# Patient Record
Sex: Female | Born: 1935 | Race: Black or African American | Hispanic: No | Marital: Married | Smoking: Former smoker
Health system: Southern US, Community
[De-identification: ages and names within clinical notes are randomized; demographics above are authoritative.]

## PROBLEM LIST (undated history)

## (undated) DIAGNOSIS — F0390 Unspecified dementia without behavioral disturbance: Secondary | ICD-10-CM

## (undated) DIAGNOSIS — F329 Major depressive disorder, single episode, unspecified: Secondary | ICD-10-CM

## (undated) DIAGNOSIS — F32A Depression, unspecified: Secondary | ICD-10-CM

## (undated) DIAGNOSIS — E039 Hypothyroidism, unspecified: Secondary | ICD-10-CM

## (undated) DIAGNOSIS — R569 Unspecified convulsions: Secondary | ICD-10-CM

## (undated) HISTORY — PX: NO PAST SURGERIES: SHX2092

---

## 2005-07-01 ENCOUNTER — Ambulatory Visit: Payer: Self-pay | Admitting: Vascular Surgery

## 2005-07-01 ENCOUNTER — Other Ambulatory Visit: Payer: Self-pay

## 2005-07-06 ENCOUNTER — Ambulatory Visit: Payer: Self-pay | Admitting: Vascular Surgery

## 2007-08-06 ENCOUNTER — Other Ambulatory Visit: Payer: Self-pay

## 2007-08-06 ENCOUNTER — Emergency Department: Payer: Self-pay | Admitting: Emergency Medicine

## 2007-12-24 ENCOUNTER — Ambulatory Visit: Payer: Self-pay | Admitting: Family Medicine

## 2012-01-28 ENCOUNTER — Emergency Department: Payer: Self-pay | Admitting: Unknown Physician Specialty

## 2012-01-28 LAB — CBC
HCT: 41.2 % (ref 35.0–47.0)
HGB: 13.5 g/dL (ref 12.0–16.0)
MCV: 90 fL (ref 80–100)
Platelet: 171 10*3/uL (ref 150–440)
RBC: 4.59 10*6/uL (ref 3.80–5.20)
WBC: 4.6 10*3/uL (ref 3.6–11.0)

## 2012-01-29 LAB — COMPREHENSIVE METABOLIC PANEL
Albumin: 4.4 g/dL (ref 3.4–5.0)
Alkaline Phosphatase: 143 U/L — ABNORMAL HIGH (ref 50–136)
BUN: 10 mg/dL (ref 7–18)
Bilirubin,Total: 0.2 mg/dL (ref 0.2–1.0)
Chloride: 104 mmol/L (ref 98–107)
Creatinine: 0.73 mg/dL (ref 0.60–1.30)
EGFR (African American): >60
Glucose: 93 mg/dL (ref 65–99)
Osmolality: 284 (ref 275–301)
SGOT(AST): 19 U/L (ref 15–37)
Sodium: 143 mmol/L (ref 136–145)
Total Protein: 8.7 g/dL — ABNORMAL HIGH (ref 6.4–8.2)

## 2012-01-29 LAB — URINALYSIS, COMPLETE
Ketone: NEGATIVE
Nitrite: NEGATIVE
Protein: NEGATIVE
RBC,UR: 1 /HPF (ref 0–5)
Specific Gravity: 1.003 (ref 1.003–1.030)
Squamous Epithelial: 1
WBC UR: 2 /HPF (ref 0–5)

## 2012-01-29 LAB — PHENYTOIN LEVEL, TOTAL: Dilantin: 14.2 ug/mL (ref 10.0–20.0)

## 2012-01-29 LAB — TROPONIN I: Troponin-I: <0.02 ng/mL

## 2013-11-11 ENCOUNTER — Emergency Department: Payer: Self-pay | Admitting: Emergency Medicine

## 2013-11-11 LAB — COMPREHENSIVE METABOLIC PANEL
ALK PHOS: 138 U/L — AB
ALT: 26 U/L (ref 12–78)
AST: 24 U/L (ref 15–37)
Albumin: 3.2 g/dL — ABNORMAL LOW (ref 3.4–5.0)
Anion Gap: 6 — ABNORMAL LOW (ref 7–16)
BILIRUBIN TOTAL: 0.1 mg/dL — AB (ref 0.2–1.0)
BUN: 17 mg/dL (ref 7–18)
CREATININE: 0.62 mg/dL (ref 0.60–1.30)
Calcium, Total: 8.8 mg/dL (ref 8.5–10.1)
Chloride: 104 mmol/L (ref 98–107)
Co2: 33 mmol/L — ABNORMAL HIGH (ref 21–32)
GLUCOSE: 81 mg/dL (ref 65–99)
Osmolality: 286 (ref 275–301)
Potassium: 3.5 mmol/L (ref 3.5–5.1)
Sodium: 143 mmol/L (ref 136–145)
Total Protein: 7 g/dL (ref 6.4–8.2)

## 2013-11-11 LAB — URINALYSIS, COMPLETE
Bacteria: NONE SEEN
Bilirubin,UR: NEGATIVE
Blood: NEGATIVE
Glucose,UR: NEGATIVE mg/dL (ref 0–75)
KETONE: NEGATIVE
Leukocyte Esterase: NEGATIVE
Nitrite: NEGATIVE
PROTEIN: NEGATIVE
Ph: 7 (ref 4.5–8.0)
RBC,UR: NONE SEEN /HPF (ref 0–5)
Specific Gravity: 1.008 (ref 1.003–1.030)
Squamous Epithelial: <1

## 2013-11-11 LAB — CBC
HCT: 37.8 % (ref 35.0–47.0)
HGB: 12.3 g/dL (ref 12.0–16.0)
MCH: 31.4 pg (ref 26.0–34.0)
MCHC: 32.5 g/dL (ref 32.0–36.0)
MCV: 97 fL (ref 80–100)
Platelet: 152 10*3/uL (ref 150–440)
RBC: 3.91 10*6/uL (ref 3.80–5.20)
RDW: 13.6 % (ref 11.5–14.5)
WBC: 4.3 10*3/uL (ref 3.6–11.0)

## 2013-11-11 LAB — PHENOBARBITAL LEVEL: Phenobarbital: 39.8 ug/mL (ref 15.0–40.0)

## 2013-11-11 LAB — TROPONIN I: Troponin-I: <0.02 ng/mL

## 2013-11-11 LAB — PHENYTOIN LEVEL, TOTAL: Dilantin: 19 ug/mL (ref 10.0–20.0)

## 2014-05-08 ENCOUNTER — Emergency Department: Payer: Self-pay | Admitting: Emergency Medicine

## 2015-01-16 ENCOUNTER — Encounter: Payer: Self-pay | Admitting: Emergency Medicine

## 2015-01-16 ENCOUNTER — Observation Stay
Admission: EM | Admit: 2015-01-16 | Discharge: 2015-01-17 | Disposition: A | Discharge status: Skilled Nursing Facility | Payer: Medicare Other | Attending: Internal Medicine | Admitting: Internal Medicine

## 2015-01-16 ENCOUNTER — Emergency Department: Payer: Medicare Other

## 2015-01-16 ENCOUNTER — Observation Stay: Payer: Medicare Other

## 2015-01-16 DIAGNOSIS — Z79899 Other long term (current) drug therapy: Secondary | ICD-10-CM | POA: Diagnosis not present

## 2015-01-16 DIAGNOSIS — N390 Urinary tract infection, site not specified: Principal | ICD-10-CM | POA: Insufficient documentation

## 2015-01-16 DIAGNOSIS — R4182 Altered mental status, unspecified: Secondary | ICD-10-CM | POA: Insufficient documentation

## 2015-01-16 DIAGNOSIS — F0391 Unspecified dementia with behavioral disturbance: Secondary | ICD-10-CM | POA: Insufficient documentation

## 2015-01-16 DIAGNOSIS — F329 Major depressive disorder, single episode, unspecified: Secondary | ICD-10-CM | POA: Insufficient documentation

## 2015-01-16 DIAGNOSIS — G40909 Epilepsy, unspecified, not intractable, without status epilepticus: Secondary | ICD-10-CM | POA: Insufficient documentation

## 2015-01-16 DIAGNOSIS — F0390 Unspecified dementia without behavioral disturbance: Secondary | ICD-10-CM | POA: Diagnosis present

## 2015-01-16 DIAGNOSIS — E039 Hypothyroidism, unspecified: Secondary | ICD-10-CM | POA: Insufficient documentation

## 2015-01-16 DIAGNOSIS — R140 Abdominal distension (gaseous): Secondary | ICD-10-CM

## 2015-01-16 DIAGNOSIS — R569 Unspecified convulsions: Secondary | ICD-10-CM

## 2015-01-16 HISTORY — DX: Depression, unspecified: F32.A

## 2015-01-16 HISTORY — DX: Unspecified convulsions: R56.9

## 2015-01-16 HISTORY — DX: Unspecified dementia, unspecified severity, without behavioral disturbance, psychotic disturbance, mood disturbance, and anxiety: F03.90

## 2015-01-16 HISTORY — DX: Major depressive disorder, single episode, unspecified: F32.9

## 2015-01-16 HISTORY — DX: Hypothyroidism, unspecified: E03.9

## 2015-01-16 LAB — URINALYSIS COMPLETE WITH MICROSCOPIC (ARMC ONLY)
BILIRUBIN URINE: NEGATIVE
GLUCOSE, UA: NEGATIVE mg/dL
HGB URINE DIPSTICK: NEGATIVE
KETONES UR: NEGATIVE mg/dL
Nitrite: NEGATIVE
PROTEIN: NEGATIVE mg/dL
RBC / HPF: NONE SEEN RBC/hpf (ref 0–5)
SQUAMOUS EPITHELIAL / LPF: NONE SEEN
Specific Gravity, Urine: 1.01 (ref 1.005–1.030)
pH: 7 (ref 5.0–8.0)

## 2015-01-16 LAB — CBC WITH DIFFERENTIAL/PLATELET
Basophils Absolute: 0 10*3/uL (ref 0–0.1)
Basophils Relative: 0 %
Eosinophils Absolute: 0 10*3/uL (ref 0–0.7)
Eosinophils Relative: 1 %
HEMATOCRIT: 38.3 % (ref 35.0–47.0)
Hemoglobin: 12.7 g/dL (ref 12.0–16.0)
Lymphocytes Relative: 33 %
Lymphs Abs: 1.1 10*3/uL (ref 1.0–3.6)
MCH: 30.9 pg (ref 26.0–34.0)
MCHC: 33.1 g/dL (ref 32.0–36.0)
MCV: 93.6 fL (ref 80.0–100.0)
MONO ABS: 0.4 10*3/uL (ref 0.2–0.9)
Monocytes Relative: 11 %
NEUTROS ABS: 1.8 10*3/uL (ref 1.4–6.5)
Neutrophils Relative %: 55 %
Platelets: 203 10*3/uL (ref 150–440)
RBC: 4.09 MIL/uL (ref 3.80–5.20)
RDW: 14.1 % (ref 11.5–14.5)
WBC: 3.3 10*3/uL — ABNORMAL LOW (ref 3.6–11.0)

## 2015-01-16 LAB — COMPREHENSIVE METABOLIC PANEL
ALBUMIN: 3.7 g/dL (ref 3.5–5.0)
ALK PHOS: 133 U/L — AB (ref 38–126)
ALT: 18 U/L (ref 14–54)
AST: 28 U/L (ref 15–41)
Anion gap: 9 (ref 5–15)
BUN: 19 mg/dL (ref 6–20)
CALCIUM: 9 mg/dL (ref 8.9–10.3)
CHLORIDE: 100 mmol/L — AB (ref 101–111)
CO2: 30 mmol/L (ref 22–32)
Creatinine, Ser: 0.73 mg/dL (ref 0.44–1.00)
GLUCOSE: 116 mg/dL — AB (ref 65–99)
POTASSIUM: 4.1 mmol/L (ref 3.5–5.1)
Sodium: 139 mmol/L (ref 135–145)
Total Bilirubin: 0.3 mg/dL (ref 0.3–1.2)
Total Protein: 7.4 g/dL (ref 6.5–8.1)

## 2015-01-16 LAB — TROPONIN I

## 2015-01-16 LAB — ETHANOL

## 2015-01-16 LAB — SALICYLATE LEVEL

## 2015-01-16 LAB — ACETAMINOPHEN LEVEL

## 2015-01-16 MED ORDER — HALOPERIDOL 1 MG PO TABS
0.5000 mg | ORAL_TABLET | Freq: Three times a day (TID) | ORAL | Status: DC | PRN
  Administered 2015-01-16: 0.5 mg via ORAL
  Filled 2015-01-16: qty 1

## 2015-01-16 MED ORDER — OLANZAPINE 5 MG PO TABS
5.0000 mg | ORAL_TABLET | Freq: Every day | ORAL | Status: DC
  Filled 2015-01-16: qty 1

## 2015-01-16 MED ORDER — ONDANSETRON HCL 4 MG PO TABS: 4.0000 mg | ORAL_TABLET | Freq: Four times a day (QID) | ORAL | Status: DC | PRN

## 2015-01-16 MED ORDER — LORAZEPAM 2 MG/ML IJ SOLN
0.5000 mg | Freq: Once | INTRAMUSCULAR | Status: AC
  Administered 2015-01-16: 0.5 mg via INTRAVENOUS
  Filled 2015-01-16: qty 1

## 2015-01-16 MED ORDER — ACETAMINOPHEN 650 MG RE SUPP: 650.0000 mg | Freq: Four times a day (QID) | RECTAL | Status: DC | PRN

## 2015-01-16 MED ORDER — LEVOTHYROXINE SODIUM 25 MCG PO TABS
25.0000 ug | ORAL_TABLET | Freq: Every day | ORAL | Status: DC
  Administered 2015-01-17: 25 ug via ORAL
  Filled 2015-01-16: qty 1

## 2015-01-16 MED ORDER — PHENOBARBITAL 32.4 MG PO TABS
64.8000 mg | ORAL_TABLET | Freq: Every day | ORAL | Status: DC
  Administered 2015-01-16: 64.8 mg via ORAL
  Filled 2015-01-16: qty 2

## 2015-01-16 MED ORDER — QUETIAPINE FUMARATE 25 MG PO TABS
50.0000 mg | ORAL_TABLET | Freq: Three times a day (TID) | ORAL | Status: DC
  Administered 2015-01-17: 50 mg via ORAL
  Filled 2015-01-16: qty 2

## 2015-01-16 MED ORDER — DONEPEZIL HCL 5 MG PO TABS
10.0000 mg | ORAL_TABLET | Freq: Every day | ORAL | Status: DC
  Administered 2015-01-17: 10 mg via ORAL
  Filled 2015-01-16: qty 2

## 2015-01-16 MED ORDER — DIPHENHYDRAMINE HCL 50 MG/ML IJ SOLN
INTRAMUSCULAR | Status: AC
  Administered 2015-01-16: 12.5 mg via INTRAVENOUS
  Filled 2015-01-16: qty 1

## 2015-01-16 MED ORDER — HALOPERIDOL LACTATE 5 MG/ML IJ SOLN
INTRAMUSCULAR | Status: AC
  Administered 2015-01-16: 5 mg via INTRAMUSCULAR
  Filled 2015-01-16: qty 1

## 2015-01-16 MED ORDER — LEVOFLOXACIN 750 MG PO TABS
750.0000 mg | ORAL_TABLET | Freq: Once | ORAL | Status: AC
  Administered 2015-01-16: 750 mg via ORAL
  Filled 2015-01-16: qty 1

## 2015-01-16 MED ORDER — DIPHENHYDRAMINE HCL 50 MG/ML IJ SOLN
12.5000 mg | Freq: Once | INTRAMUSCULAR | Status: AC
  Administered 2015-01-16: 12.5 mg via INTRAVENOUS

## 2015-01-16 MED ORDER — LORAZEPAM 2 MG/ML IJ SOLN
0.5000 mg | Freq: Once | INTRAMUSCULAR | Status: AC
  Administered 2015-01-16: 0.5 mg via INTRAVENOUS

## 2015-01-16 MED ORDER — ONDANSETRON HCL 4 MG/2ML IJ SOLN: 4.0000 mg | Freq: Four times a day (QID) | INTRAMUSCULAR | Status: DC | PRN

## 2015-01-16 MED ORDER — LEVOFLOXACIN 750 MG PO TABS
750.0000 mg | ORAL_TABLET | Freq: Every day | ORAL | Status: DC
  Administered 2015-01-17: 10:00:00 750 mg via ORAL
  Filled 2015-01-16: qty 1

## 2015-01-16 MED ORDER — DIVALPROEX SODIUM 125 MG PO CSDR
125.0000 mg | DELAYED_RELEASE_CAPSULE | Freq: Two times a day (BID) | ORAL | Status: DC
  Administered 2015-01-16 – 2015-01-17 (×2): 125 mg via ORAL
  Filled 2015-01-16 (×3): qty 1

## 2015-01-16 MED ORDER — ENOXAPARIN SODIUM 40 MG/0.4ML ~~LOC~~ SOLN
40.0000 mg | SUBCUTANEOUS | Status: DC
Start: 2015-01-16 — End: 2015-01-17
  Administered 2015-01-17: 10:00:00 40 mg via SUBCUTANEOUS
  Filled 2015-01-16: qty 0.4

## 2015-01-16 MED ORDER — PHENYTOIN SODIUM EXTENDED 100 MG PO CAPS
100.0000 mg | ORAL_CAPSULE | Freq: Every day | ORAL | Status: DC
  Administered 2015-01-17: 100 mg via ORAL
  Filled 2015-01-16: qty 1

## 2015-01-16 MED ORDER — MIRTAZAPINE 15 MG PO TABS
15.0000 mg | ORAL_TABLET | Freq: Every day | ORAL | Status: DC
  Administered 2015-01-16: 15 mg via ORAL
  Filled 2015-01-16: qty 1

## 2015-01-16 MED ORDER — HALOPERIDOL LACTATE 5 MG/ML IJ SOLN
5.0000 mg | Freq: Once | INTRAMUSCULAR | Status: AC
  Administered 2015-01-16: 5 mg via INTRAMUSCULAR

## 2015-01-16 MED ORDER — OLANZAPINE 5 MG PO TABS
5.0000 mg | ORAL_TABLET | Freq: Every day | ORAL | Status: DC
  Administered 2015-01-16: 5 mg via ORAL
  Filled 2015-01-16 (×2): qty 1

## 2015-01-16 MED ORDER — ACETAMINOPHEN 325 MG PO TABS: 650.0000 mg | ORAL_TABLET | Freq: Four times a day (QID) | ORAL | Status: DC | PRN

## 2015-01-16 MED ORDER — SODIUM CHLORIDE 0.9 % IV SOLN
INTRAVENOUS | Status: DC
  Administered 2015-01-16: 23:00:00 via INTRAVENOUS

## 2015-01-16 NOTE — ED Notes (Signed)
Patient changed.  Skin care given

## 2015-01-16 NOTE — ED Notes (Signed)
Patient changed.  Skin care given 

## 2015-01-16 NOTE — ED Provider Notes (Signed)
[facility] Emergency Department Provider Note  ____________________________________________  Time seen: Approximately 4:09 PM  I have reviewed the triage vital signs and the nursing notes.   HISTORY  Chief Complaint Altered Mental Status  Caveat-history of present illness and review of systems limited secondary to the patient's altered mental status/poor cooperation, dementia. All history of present illness review of systems obtained from staff at [facility] health care.  HPI Geneal Huebert is a 79 y.o. female with history of Alzheimer's dementia, hypothyroidism, conversion disorder with seizures or convulsions, depression, restlessness and agitation presents from [place] [facility] for worsening agitation since yesterday. Staff at the facility report that the patient has just been screaming and crying since yesterday. She has not had any recent illness including no coughing, vomiting, diarrhea, fevers or falls.  Staff reports that they don't know her well however, she has only been at the facility for one week. Family reported to staff at [facility] that sometimes she screams and cries when she is hungry, staff has been trying to feed the patient however this only helps her intermittently.No other history is obtained.   Past Medical History  Diagnosis Date  . Dementia   . Hypothyroidism   . Seizures   . Depression     Patient Active Problem List   Diagnosis Date Noted  . UTI (lower urinary tract infection) 01/16/2015  . Dementia 01/16/2015  . Hypothyroidism 01/16/2015  . Seizures 01/16/2015    Past Surgical History  Procedure Laterality Date  . No past surgeries      Current Outpatient Rx  Name  Route  Sig  Dispense  Refill  . acetaminophen (TYLENOL) 650 MG CR tablet   Oral   Take 650 mg by mouth every 12 (twelve) hours.         . calcium-vitamin D (OSCAL WITH D) 500-200 MG-UNIT per tablet   Oral   Take 1 tablet by mouth every 12  (twelve) hours.         . cholecalciferol (VITAMIN D) 1000 UNITS tablet   Oral   Take 1,000 Units by mouth daily.         . divalproex (DEPAKOTE SPRINKLES) 125 MG capsule   Oral   Take 125 mg by mouth every 12 (twelve) hours.         Marland Kitchen donepezil (ARICEPT) 10 MG tablet   Oral   Take 10 mg by mouth daily.         . furosemide (LASIX) 20 MG tablet   Oral   Take 20 mg by mouth daily as needed for edema.         . haloperidol (HALDOL) 0.5 MG tablet   Oral   Take 0.5 mg by mouth every 8 (eight) hours as needed for agitation.         Marland Kitchen levothyroxine (SYNTHROID, LEVOTHROID) 25 MCG tablet   Oral   Take 25 mcg by mouth daily before breakfast.         . loratadine (CLARITIN) 10 MG tablet   Oral   Take 10 mg by mouth daily.         . mirtazapine (REMERON) 15 MG tablet   Oral   Take 15 mg by mouth at bedtime.         Marland Kitchen OLANZapine (ZYPREXA) 5 MG tablet   Oral   Take 5 mg by mouth at bedtime.         Marland Kitchen PHENobarbital (LUMINAL) 64.8 MG tablet   Oral  Take 64.8 mg by mouth at bedtime.         . phenytoin (DILANTIN) 100 MG ER capsule   Oral   Take 100 mg by mouth daily.         . QUEtiapine (SEROQUEL) 50 MG tablet   Oral   Take 50 mg by mouth 3 (three) times daily.           Allergies Review of patient's allergies indicates no known allergies.  Family History  Problem Relation Age of Onset  . Family history unknown: Yes    Social History History  Substance Use Topics  . Smoking status: Not on file  . Smokeless tobacco: Not on file  . Alcohol Use: No    Review of Systems   Caveat-history of present illness and review of systems limited secondary to the patient's altered mental status/poor cooperation, dementia. All history of present illness review of systems obtained from staff at [facility] health care. ____________________________________________   PHYSICAL EXAM:  Filed Vitals:   01/16/15 1624 01/16/15 2054  BP: 150/81 140/76   Pulse: 84 82  Temp: 97.8 F (36.6 C)   TempSrc: Oral   Resp: 16 15  Height:  (1.651 m)   Weight: 150 lb (68.04 kg)   SpO2: 94% 94%    Constitutional: Sitting up in bed with eyes closed, crying, screaming, does not follow commands, bent forward slightly at the hips. She is thin/achectic-appearing. Eyes: Conjunctivae are normal. PERRL. EOMI. Head: Atraumatic. Nose: No congestion/rhinnorhea. Mouth/Throat: Mucous membranes are moist.  Oropharynx non-erythematous. Neck: No stridor.   Cardiovascular: Normal rate, regular rhythm. Grossly normal heart sounds.  Good peripheral circulation. Respiratory: Normal respiratory effort.  No retractions. Lungs CTAB. Gastrointestinal: Soft and nontender. No distention.  No CVA tenderness. Genitourinary: deferred Musculoskeletal: No lower extremity tenderness nor edema.  No joint effusions. Neurologic:  She screams and cries intermittently, then becomes tired and will stop screaming and crying, mumbles that she does not want to be touched, then begin screaming and crying again. She appears to move the extremities equally however does not follow commands or cooperate with formal neurological testing.  Skin:  Skin is warm, dry and intact. No rash noted. Psychiatric: unable to assess  ____________________________________________   LABS (all labs ordered are listed, but only abnormal results are displayed)  Labs Reviewed  CBC WITH DIFFERENTIAL/PLATELET - Abnormal; Notable for the following:    WBC 3.3 (*)    All other components within normal limits  COMPREHENSIVE METABOLIC PANEL - Abnormal; Notable for the following:    Chloride 100 (*)    Glucose, Bld 116 (*)    Alkaline Phosphatase 133 (*)    All other components within normal limits  ACETAMINOPHEN LEVEL - Abnormal; Notable for the following:    Acetaminophen (Tylenol), Serum <10 (*)    All other components within normal limits  URINALYSIS COMPLETEWITH MICROSCOPIC ([facility] ONLY) - Abnormal;  Notable for the following:    Color, Urine YELLOW (*)    APPearance HAZY (*)    Leukocytes, UA 3+ (*)    Bacteria, UA RARE (*)    All other components within normal limits  TROPONIN I  ETHANOL  SALICYLATE LEVEL   ____________________________________________  EKG  ED ECG REPORT I, Gayla Doss, the attending physician, personally viewed and interpreted this ECG.   Date: 01/16/2015  EKG Time: 16:37  Rate: 83  Rhythm: normal sinus rhythm  Axis: left  Intervals:none  ST&T Change: T wave inversions in V2/nonspecific T-wave abnormalities.  No acute ST segment elevation.  ____________________________________________  RADIOLOGY  CXR  IMPRESSION: Left diaphragm elevated by underlying distended bowel. No acute cardiopulmonary findings.  ____________________________________________   PROCEDURES  Procedure(s) performed: None  Critical Care performed: No  ____________________________________________   INITIAL IMPRESSION / ASSESSMENT AND PLAN / ED COURSE  Pertinent labs & imaging results that were available during my care of the patient were reviewed by me and considered in my medical decision making (see chart for details).  Myesha Stillion is a 79 y.o. female with history of Alzheimer's dementia, hypothyroidism, conversion disorder with seizures or convulsions, depression, restlessness and agitation presents from [place] [facility] for worsening agitation since yesterday. On arrival to the emergency Department, vital signs stable, she is afebrile however she is sitting up in bed, screaming, crying, does not follow commands, is not able to verbalize anything other than the fact that she does not want to be touched.  ----------------------------------------- 8:13 PM on 01/16/2015 ----------------------------------------- Labs notable for mild leukopenia, mild nonspecific alkaline phosphatase elevation. Urinalysis consistent with urinary tract infection, 3+ leuk esterase,  too numerous to count white blood cells from a catheterized specimen. She rests intermittently and then awakens and begins screaming again, this seems to improve when she is fed and we did manage to give her 750 mg Levaquin dissolved in applesauce. I discussed the case with [place] [facility] and given that she continues to cry out intermittently, they are uncomfortable taking her back tonight. I discussed admission with the hospitalist given that she has change in mental status and documented urinary tract infection. Dr. Anne Hahn will evaluate the patient. Chest x-ray shows elevation of the left diaphragm caused by underlying distended bowel or stomach. We attempted to obtain a CT scan of the abdomen/pelvis to further evaluate for any acute intra-abdominal process however the patient is curled into a ball, will not stretch her legs out. She received Ativan, Haldol, Benadryl and is still unable to cooperate. She has no abdominal tenderness. The remainder of her liver function tests are within limits and given benign abdominal exam, will not pursue this study at this time. Additionally, I had ordered a CT scan of her head for the sake of completing her workup but because she cannot be positioned appropriately, will not obtain that at this time. According to staff at [facility] health care, she has had no falls, no head trauma recently. She does continue to move all extremities equally making acute CVA unlikely.  ____________________________________________   FINAL CLINICAL IMPRESSION(S) / ED DIAGNOSES  Final diagnoses:  Altered mental status, unspecified altered mental status type  UTI (lower urinary tract infection)      Gayla Doss, MD 01/16/15 2118

## 2015-01-16 NOTE — H&P (Signed)
[facility] - [place] at [facility]   PATIENT NAME: Julia Coffey    MR#:  [id number]  DATE OF BIRTH:  02-24-1936  DATE OF ADMISSION:  01/16/2015  PRIMARY CARE PHYSICIAN: No primary care provider on file.      REQUESTING/REFERRING PHYSICIAN: Inocencio Homes, MD  CHIEF COMPLAINT:   Chief Complaint  Patient presents with  . Altered Mental Status    HISTORY OF PRESENT ILLNESS:  Julia Coffey  is a 79 y.o. female who presents from nursing facility where she has just recently been admitted for dementia. She was sent to the ED due to significant moaning, seemingly in pain. Patient is severely demented, nonverbal and is unable to give any history. She also does not follow commands. Nursing facility states that she has had increasing moaning and crying out for an unknown cause for the past couple of days she is sent to the ED for evaluation. In the ED she was found only to have a UTI based on UA suspicious for the same. Upon contacting the nursing facility, the patient transferred back, they stated that they would be unable to take her back tonight due to an inability to provide medication to keep her calm, even though she was able to take by mouth medications and showed some improvement here in the ED.  PAST MEDICAL HISTORY:   Past Medical History  Diagnosis Date  . Dementia   . Hypothyroidism   . Seizures   . Depression     PAST SURGICAL HISTORY:   Past Surgical History  Procedure Laterality Date  . No past surgeries     **Past surgical history unknown and unable to obtain this information from patient due to dementia. SOCIAL HISTORY:   History  Substance Use Topics  . Smoking status: Not on file  . Smokeless tobacco: Not on file  . Alcohol Use: No    FAMILY HISTORY:   Family History  Problem Relation Age of Onset  . Family history unknown: Yes   **Patient unable to give family history due to dementia DRUG ALLERGIES:  No Known Allergies  MEDICATIONS  AT HOME:   Prior to Admission medications   Medication Sig Start Date End Date Taking? Authorizing Provider  acetaminophen (TYLENOL) 650 MG CR tablet Take 650 mg by mouth every 12 (twelve) hours.   Yes Historical Provider, MD  calcium-vitamin D (OSCAL WITH D) 500-200 MG-UNIT per tablet Take 1 tablet by mouth every 12 (twelve) hours.   Yes Historical Provider, MD  cholecalciferol (VITAMIN D) 1000 UNITS tablet Take 1,000 Units by mouth daily.   Yes Historical Provider, MD  divalproex (DEPAKOTE SPRINKLES) 125 MG capsule Take 125 mg by mouth every 12 (twelve) hours.   Yes Historical Provider, MD  donepezil (ARICEPT) 10 MG tablet Take 10 mg by mouth daily.   Yes Historical Provider, MD  furosemide (LASIX) 20 MG tablet Take 20 mg by mouth daily as needed for edema.   Yes Historical Provider, MD  haloperidol (HALDOL) 0.5 MG tablet Take 0.5 mg by mouth every 8 (eight) hours as needed for agitation.   Yes Historical Provider, MD  levothyroxine (SYNTHROID, LEVOTHROID) 25 MCG tablet Take 25 mcg by mouth daily before breakfast.   Yes Historical Provider, MD  loratadine (CLARITIN) 10 MG tablet Take 10 mg by mouth daily.   Yes Historical Provider, MD  mirtazapine (REMERON) 15 MG tablet Take 15 mg by mouth at bedtime.   Yes Historical Provider, MD  OLANZapine (ZYPREXA) 5 MG tablet Take  5 mg by mouth at bedtime.   Yes Historical Provider, MD  PHENobarbital (LUMINAL) 64.8 MG tablet Take 64.8 mg by mouth at bedtime.   Yes Historical Provider, MD  phenytoin (DILANTIN) 100 MG ER capsule Take 100 mg by mouth daily.   Yes Historical Provider, MD  QUEtiapine (SEROQUEL) 50 MG tablet Take 50 mg by mouth 3 (three) times daily.   Yes Historical Provider, MD    REVIEW OF SYSTEMS:  Review of Systems  Unable to perform ROS: dementia     VITAL SIGNS:   Filed Vitals:   01/16/15 1624 01/16/15 2054  BP: 150/81 140/76  Pulse: 84 82  Temp: 97.8 F (36.6 C)   TempSrc: Oral   Resp: 16 15  Height:  (1.651 m)    Weight: 68.04 kg (150 lb)   SpO2: 94% 94%   Wt Readings from Last 3 Encounters:  01/16/15 68.04 kg (150 lb)    PHYSICAL EXAMINATION:  Physical Exam  Vitals reviewed. Constitutional: She appears well-developed. No distress.  thin and frail  HENT:  Head: Normocephalic and atraumatic.  Mouth/Throat: Oropharynx is clear and moist.  Eyes: Conjunctivae and EOM are normal. Pupils are equal, round, and reactive to light. No scleral icterus.  Neck: Normal range of motion. Neck supple. No JVD present. No thyromegaly present.  Cardiovascular: Normal rate, regular rhythm and intact distal pulses.  Exam reveals no gallop and no friction rub.   No murmur heard. Respiratory: Effort normal and breath sounds normal. No respiratory distress. She has no wheezes. She has no rales.  GI: Soft. Bowel sounds are normal. She exhibits no distension. There is no tenderness.  Musculoskeletal: She exhibits no edema.  Moves all extremities spontaneously, No arthritis, no gout  Lymphadenopathy:    She has no cervical adenopathy.  Neurological:  Unable to assess due to dementia, patient does not follow commands  Skin: Skin is warm and dry. No rash noted. No erythema.  Psychiatric:  Unable to assess due to dementia    LABORATORY PANEL:   CBC  Recent Labs Lab 01/16/15 1620  WBC 3.3*  HGB 12.7  HCT 38.3  PLT 203   ------------------------------------------------------------------------------------------------------------------  Chemistries   Recent Labs Lab 01/16/15 1620  NA 139  K 4.1  CL 100*  CO2 30  GLUCOSE 116*  BUN 19  CREATININE 0.73  CALCIUM 9.0  AST 28  ALT 18  ALKPHOS 133*  BILITOT 0.3   ------------------------------------------------------------------------------------------------------------------  Cardiac Enzymes  Recent Labs Lab 01/16/15 1620  TROPONINI <0.03    ------------------------------------------------------------------------------------------------------------------  RADIOLOGY:  Dg Chest Portable 1 View  01/16/2015   CLINICAL DATA:  Altered mental status  EXAM: PORTABLE CHEST - 1 VIEW  COMPARISON:  01/28/2012  FINDINGS: Heart size and vascular pattern normal. Lungs clear. Gaseous distention of the stomach and or large bowel elevates the left diaphragm.  IMPRESSION: Left diaphragm elevated by underlying distended bowel. No acute cardiopulmonary findings.   Electronically Signed   By: Esperanza Heir M.D.   On: 01/16/2015 18:32    EKG:   Orders placed or performed in visit on 11/11/13  . EKG 12-Lead    IMPRESSION AND PLAN:  Principal Problem:   UTI (lower urinary tract infection) - by mouth Levaquin and send urine culture Active Problems:   Dementia - continue medications for this, including when necessary medications for agitation and behavioral disturbance, QTC on EKG was not prolonged   Hypothyroidism - continue home medication for this   Seizures - continue home antiepileptics,  seizure precautions while here  DISPO - this patient will need to be transferred back to her nursing facility tomorrow morning, when they're able to have their physician write orders for her, and to get appropriate medications from their pharmacy.  All the records are reviewed and case discussed with ED provider. Management plans discussed with the patient and/or family.  DVT PROPHYLAXIS: SubQ lovenox  ADMISSION STATUS: Observation  CODE STATUS: Full  TOTAL TIME TAKING CARE OF THIS PATIENT: 55 minutes.    Placido Hangartner FIELDING 01/16/2015, 9:17 PM  [facility]  Office  [phone]  CC: Primary care physician; No primary care provider on file.

## 2015-01-16 NOTE — ED Notes (Signed)
From [facility]

## 2015-01-16 NOTE — ED Notes (Signed)
Sent from [facility] for complaints of AMS.  EMS states patient has been at [facility] for 1 week due to dementia history.  For the past two days patient has been yelling out an has been un consolable.  Here in ED for evaluation.

## 2015-01-16 NOTE — Progress Notes (Signed)
Nurse received report from Erskine Squibb RN, patient transferring to room 107. Patient arrived to floor and was yelling out, she is disoriented x 4, transferred to bed from stretcher with assist, attempted to calm patient, patient yells in intervals. Will pass on in report to oncoming nurse>

## 2015-01-17 LAB — CBC
HCT: 36.4 % (ref 35.0–47.0)
HEMOGLOBIN: 12.1 g/dL (ref 12.0–16.0)
MCH: 31.1 pg (ref 26.0–34.0)
MCHC: 33.3 g/dL (ref 32.0–36.0)
MCV: 93.4 fL (ref 80.0–100.0)
Platelets: 203 10*3/uL (ref 150–440)
RBC: 3.9 MIL/uL (ref 3.80–5.20)
RDW: 13.9 % (ref 11.5–14.5)
WBC: 3.5 10*3/uL — AB (ref 3.6–11.0)

## 2015-01-17 LAB — BASIC METABOLIC PANEL
Anion gap: 5 (ref 5–15)
BUN: 18 mg/dL (ref 6–20)
CO2: 34 mmol/L — AB (ref 22–32)
Calcium: 9.1 mg/dL (ref 8.9–10.3)
Chloride: 102 mmol/L (ref 101–111)
Creatinine, Ser: 0.73 mg/dL (ref 0.44–1.00)
GFR calc Af Amer: >60 mL/min (ref >60)
GLUCOSE: 78 mg/dL (ref 65–99)
POTASSIUM: 4.3 mmol/L (ref 3.5–5.1)
Sodium: 141 mmol/L (ref 135–145)

## 2015-01-17 MED ORDER — HALOPERIDOL 1 MG PO TABS: 5.0000 mg | ORAL_TABLET | Freq: Three times a day (TID) | ORAL | Status: DC | PRN

## 2015-01-17 MED ORDER — ENOXAPARIN SODIUM 30 MG/0.3ML ~~LOC~~ SOLN: 30.0000 mg | SUBCUTANEOUS | Status: DC

## 2015-01-17 MED ORDER — LEVOFLOXACIN 750 MG PO TABS: 750.0000 mg | ORAL_TABLET | Freq: Every day | ORAL | Status: DC

## 2015-01-17 MED ORDER — LEVOFLOXACIN 750 MG PO TABS: 750.0000 mg | ORAL_TABLET | ORAL | Status: DC

## 2015-01-17 MED ORDER — LEVOFLOXACIN 750 MG PO TABS: 750.0000 mg | ORAL_TABLET | Freq: Every day | ORAL | Status: AC

## 2015-01-17 NOTE — Progress Notes (Signed)
MD order received to discharge pt back to [facility]; [place] SW with Care Management previously created discharge packet for EMS to take to the facility; report called to [facility] [phone] Room 39, spoke to [employer], gave telephone report with no further questions voiced at this time, facility requests that the pt be brought over after 1530; 911 called and requested transport after 1530 today; pt's discharge pending arrival of EMS for nonemergency transport

## 2015-01-17 NOTE — Clinical Social Work Note (Signed)
Clinical Social Work Assessment  Patient Details  Name: Julia Coffey MRN: [id number] Date of Birth: August 11, 1935  Date of referral:  01/17/15               Reason for consult:  Abuse/Neglect, Facility Placement, Other (Comment Required) (From [facility] SN[facility] )                Permission sought to share information with:    Permission granted to share information::     [facility]  Agency::   [facility]illed [facility] Facility   Relationship::     Contact Information:     Housing/Transportation Living arrangements for the past 2 months:  Skilled Building surveyor of Information:  Medical Team, Facility Patient Interpreter Needed:  None Criminal Activity/Legal Involvement Pertinent to Current Situation/Hospitalization:  No - Comment as needed Significant Relationships:  Other(Comment) (APS ) Lives with:  Facility Resident Do you feel safe going back to the place where you live?    Need for family participation in patient care:     Care giving concerns:  Patient is a new resident at [facility] / plan: Per RN patient is not alert and oriented and screams out constantly. Clinical Child psychotherapist (CSW) contacted [employer] at [facility]. Per Rosey Bath patient came to them from [facility] and has only been with there for a week. Per Rosey Bath Adult Protective Services (APS) of [facility] is following patient and going to court next week for guardianship. Per Rosey Bath APS worker is [employer]. Rosey Bath also reported that patient has been screaming out at the facility non-stop for the past week. Per Rosey Bath that MD has ordered Haldol and other medications that do not help with the screaming. Per Rosey Bath they can accept patient back today. Per MD patient is medically stable for D/C today.   Employment status:  Retired Health and safety inspector:  Medicare PT Recommendations:  Not assessed at this  time Information / Referral to community resources:  Skilled Holiday representative, APS (Comment Required: [place], Name & Number of worker spoken with) (APS [place] )  Patient/Family's Response to care: Patient is not alert and oriented.   Patient/Family's Understanding of and Emotional Response to Diagnosis, Current Treatment, and Prognosis: Patient is not alert and oriented.   Emotional Assessment Appearance:  Appears stated age Attitude/Demeanor/Rapport:  Screaming Affect (typically observed):    Orientation:  Fluctuating Orientation (Suspected and/or reported Sundowners) Alcohol / Substance use:  Not Applicable Psych involvement (Current and /or in the community):  Yes (Comment)  Discharge Needs  Concerns to be addressed:  Discharge Planning Concerns Readmission within the last 30 days:  No Current discharge risk:  Chronically ill, Cognitively Impaired Barriers to Discharge:  No Barriers Identified   Haig Prophet, LCSW 01/17/2015, 3:24 PM

## 2015-01-17 NOTE — Plan of Care (Signed)
Problem: Discharge Progression Outcomes Goal: Discharge plan in place and appropriate Individualization Pt is from [facility] with several days increased yelling out-UTI Planning to discharge back this am Goal: Other Discharge Outcomes/Goals Plan of care progress to goal: Pain: no apparent pain Hemodynamically stable: VSS Diet: total feed with apple suache Activity: stays in fetal position, can move all extremities  Pt non cooperative, yelling most of shift-new IV site started

## 2015-01-17 NOTE — Progress Notes (Signed)
Patient is medically stable for D/C back to [facility] today. Per [facility] admissions coordinator patient is going to a private room 39. Per Rosey Bath a private room may help with patient screaming. Per Rosey Bath APS worker is [employer]. CSW contacted on call APS worker today and made him aware of above. CSW also made APS worker aware that per RN patient is eating like she is starving. CSW contacted Donnetta Hail the person listed as the emergency contact on patient's facesheet. Per Carney Bern she was at a family reunion and could not hear CSW and hung up. Please reconsult if future social work needs arise. CSW signing off.  Jetta Lout, LCSWA [phone]

## 2015-01-17 NOTE — Consult Note (Signed)
ANTIBIOTIC CONSULT NOTE - INITIAL  Pharmacy Consult for renally adjusting antibiotics (levaquin) Indication: UTI  No Known Allergies  Patient Measurements: Height:  (165.1 cm) Weight: 94 lb 12.8 oz (43.001 kg) IBW/kg (Calculated) : 57 Adjusted Body Weight:   Vital Signs: Temp: 97.7 F (36.5 C) (08/06 1436) Temp Source: Oral (08/06 1436) BP: 104/57 mmHg (08/06 1436) Pulse Rate: 72 (08/06 1436) Intake/Output from previous day: 08/05 0701 - 08/06 0700 In: -  Out: 3 [Urine:1; Stool:2] Intake/Output from this shift: Total I/O In: 917 [P.O.:360; I.V.:557] Out: -   Labs:  Recent Labs  01/16/15 1620 01/17/15 0755  WBC 3.3* 3.5*  HGB 12.7 12.1  PLT 203 203  CREATININE 0.73 0.73   Estimated Creatinine Clearance: 40.6 mL/min (by C-G formula based on Cr of 0.73). No results for input(s): VANCOTROUGH, VANCOPEAK, VANCORANDOM, GENTTROUGH, GENTPEAK, GENTRANDOM, TOBRATROUGH, TOBRAPEAK, TOBRARND, AMIKACINPEAK, AMIKACINTROU, AMIKACIN in the last 72 hours.   Microbiology: No results found for this or any previous visit (from the past 720 hour(s)).  Medical History: Past Medical History  Diagnosis Date  . Dementia   . Hypothyroidism   . Seizures   . Depression     Medications:  Scheduled:  . divalproex  125 mg Oral Q12H  . donepezil  10 mg Oral Daily  . [START ON 01/18/2015] enoxaparin (LOVENOX) injection  30 mg Subcutaneous Q24H  . [START ON 01/19/2015] levofloxacin  750 mg Oral Q48H  . levothyroxine  25 mcg Oral QAC breakfast  . mirtazapine  15 mg Oral QHS  . OLANZapine  5 mg Oral QHS  . OLANZapine  5 mg Oral QHS  . PHENobarbital  64.8 mg Oral QHS  . phenytoin  100 mg Oral Daily  . QUEtiapine  50 mg Oral TID   Assessment: Pt is a 79 year old female with altered mental status and UTI. Crcl is 40.6  Goal of Therapy:  resolution of infection  Plan:  Will renally adjust levofloxacin to  q 48 hours. patient last recieved dose today. next dose will be on  monday  Melissa D Maccia 01/17/2015,2:41 PM
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
## 2015-01-19 LAB — URINE CULTURE: Culture: >=100000

## 2015-06-10 ENCOUNTER — Emergency Department: Payer: Medicare Other

## 2015-06-10 ENCOUNTER — Encounter: Payer: Self-pay | Admitting: Emergency Medicine

## 2015-06-10 ENCOUNTER — Emergency Department
Admission: EM | Admit: 2015-06-10 | Discharge: 2015-06-10 | Disposition: A | Discharge status: Home / Self Care | Payer: Medicare Other | Attending: Emergency Medicine | Admitting: Emergency Medicine

## 2015-06-10 DIAGNOSIS — Y998 Other external cause status: Secondary | ICD-10-CM | POA: Insufficient documentation

## 2015-06-10 DIAGNOSIS — Y9389 Activity, other specified: Secondary | ICD-10-CM | POA: Diagnosis not present

## 2015-06-10 DIAGNOSIS — S0990XA Unspecified injury of head, initial encounter: Secondary | ICD-10-CM | POA: Diagnosis present

## 2015-06-10 DIAGNOSIS — Z87891 Personal history of nicotine dependence: Secondary | ICD-10-CM | POA: Insufficient documentation

## 2015-06-10 DIAGNOSIS — W19XXXA Unspecified fall, initial encounter: Secondary | ICD-10-CM

## 2015-06-10 DIAGNOSIS — Y9289 Other specified places as the place of occurrence of the external cause: Secondary | ICD-10-CM | POA: Diagnosis not present

## 2015-06-10 DIAGNOSIS — F0390 Unspecified dementia without behavioral disturbance: Secondary | ICD-10-CM | POA: Insufficient documentation

## 2015-06-10 DIAGNOSIS — Z79899 Other long term (current) drug therapy: Secondary | ICD-10-CM | POA: Diagnosis not present

## 2015-06-10 DIAGNOSIS — W1839XA Other fall on same level, initial encounter: Secondary | ICD-10-CM | POA: Insufficient documentation

## 2015-06-10 DIAGNOSIS — S0083XA Contusion of other part of head, initial encounter: Secondary | ICD-10-CM | POA: Insufficient documentation

## 2015-06-10 DIAGNOSIS — Z792 Long term (current) use of antibiotics: Secondary | ICD-10-CM | POA: Insufficient documentation

## 2015-06-10 NOTE — Discharge Instructions (Signed)
Head Injury, Adult °You have a head injury. Headaches and throwing up (vomiting) are common after a head injury. It should be easy to wake up from sleeping. Sometimes you must stay in the hospital. Most problems happen within the first 24 hours. Side effects may occur up to 7-10 days after the injury.  °WHAT ARE THE TYPES OF HEAD INJURIES? °Head injuries can be as minor as a bump. Some head injuries can be more severe. More severe head injuries include: °· A jarring injury to the brain (concussion). °· A bruise of the brain (contusion). This mean there is bleeding in the brain that can cause swelling. °· A cracked skull (skull fracture). °· Bleeding in the brain that collects, clots, and forms a bump (hematoma). °WHEN SHOULD I GET HELP RIGHT AWAY?  °· You are confused or sleepy. °· You cannot be woken up. °· You feel sick to your stomach (nauseous) or keep throwing up (vomiting). °· Your dizziness or unsteadiness is getting worse. °· You have very bad, lasting headaches that are not helped by medicine. Take medicines only as told by your doctor. °· You cannot use your arms or legs like normal. °· You cannot walk. °· You notice changes in the black spots in the center of the colored part of your eye (pupil). °· You have clear or bloody fluid coming from your nose or ears. °· You have trouble seeing. °During the next 24 hours after the injury, you must stay with someone who can watch you. This person should get help right away (call 911 in the [place] if you start to shake and are not able to control it (have seizures), you pass out, or you are unable to wake up. °HOW CAN I PREVENT A HEAD INJURY IN THE FUTURE? °· Wear seat belts. °· Wear a helmet while bike riding and playing sports like football. °· Stay away from dangerous activities around the house. °WHEN CAN I RETURN TO NORMAL ACTIVITIES AND ATHLETICS? °See your doctor before doing these activities. You should not do normal activities or play contact sports until 1  week after the following symptoms have stopped: °· Headache that does not go away. °· Dizziness. °· Poor attention. °· Confusion. °· Memory problems. °· Sickness to your stomach or throwing up. °· Tiredness. °· Fussiness. °· Bothered by bright lights or loud noises. °· Anxiousness or depression. °· Restless sleep. °MAKE SURE YOU:  °· Understand these instructions. °· Will watch your condition. °· Will get help right away if you are not doing well or get worse. °  °This information is not intended to replace advice given to you by your health care provider. Make sure you discuss any questions you have with your health care provider. °  °Document Released: 05/12/2008 Document Revised: 06/20/2014 Document Reviewed: 02/04/2013 °Elsevier Interactive Patient Education ©2016 [employer] ° °

## 2015-06-10 NOTE — ED Notes (Signed)
Pt presents to ED via EMS for unwitnessed fall. Hematoma on forehead noted. Pt from [facility], Hx of severe dementia. Per EMS, BP-166/85, O2-96% on room air, HR-76.

## 2015-06-10 NOTE — ED Notes (Signed)
Spoke with Arline Aspindy at [facility]. States patient was found on mat (on floor) in room right after shift change. States patient was able to tell them that she was hurting but that was all. States due to inabilility to assess patient due to severe dementia, they called EMS.

## 2015-06-10 NOTE — ED Provider Notes (Signed)
[facility] Emergency Department Provider Note  ____________________________________________  Time seen: Approximately 12:40 AM  I have reviewed the triage vital signs and the nursing notes.   HISTORY  Chief Complaint Fall  Severe dememtia  HPI Selena LesserKathleen Sahm is a 79 y.o. female who comes in from her nursing home with a possible fall. The patient has a history of severe dementia and when the nurse went into her room noticed a bruise to her forehead. They assume that the patient fell and were concerned so decided to bring her in for evaluation. Otherwise the patient is acting at her baseline. The patient is not ambulatory. When asked if she is hurting she says no. I also asked the patient if she remembers falling and she says no. I am unable to assess pain at this time.The patient was found face down on the mat next to her bed.    Past Medical History  Diagnosis Date  . Dementia   . Hypothyroidism   . Seizures (HCC)   . Depression     Patient Active Problem List   Diagnosis Date Noted  . UTI (lower urinary tract infection) 01/16/2015  . Dementia 01/16/2015  . Hypothyroidism 01/16/2015  . Seizures (HCC) 01/16/2015    Past Surgical History  Procedure Laterality Date  . No past surgeries      Current Outpatient Rx  Name  Route  Sig  Dispense  Refill  . acetaminophen (TYLENOL) 650 MG CR tablet   Oral   Take 650 mg by mouth every 12 (twelve) hours.         . calcium-vitamin D (OSCAL WITH D) 500-200 MG-UNIT per tablet   Oral   Take 1 tablet by mouth every 12 (twelve) hours.         . Cholecalciferol (VITAMIN D3) 5000 units TABS   Oral   Take 1 tablet by mouth every 30 (thirty) days. Last dose was given 05/31/15         . divalproex (DEPAKOTE SPRINKLES) 125 MG capsule   Oral   Take 250 mg by mouth 3 (three) times daily.          Marland Kitchen. donepezil (ARICEPT) 10 MG tablet   Oral   Take 10 mg by mouth daily.         . furosemide (LASIX) 20  MG tablet   Oral   Take 20 mg by mouth daily as needed for edema.         Marland Kitchen. levothyroxine (SYNTHROID, LEVOTHROID) 25 MCG tablet   Oral   Take 25 mcg by mouth daily before breakfast.         . loratadine (CLARITIN) 10 MG tablet   Oral   Take 10 mg by mouth daily.         Marland Kitchen. LORazepam (ATIVAN) 0.5 MG tablet   Oral   Take 0.5 mg by mouth 2 (two) times daily.         . mirtazapine (REMERON) 15 MG tablet   Oral   Take 15 mg by mouth at bedtime.         Marland Kitchen. OLANZapine (ZYPREXA) 5 MG tablet   Oral   Take 10 mg by mouth 2 (two) times daily.          Marland Kitchen. PHENobarbital (LUMINAL) 64.8 MG tablet   Oral   Take 64.8 mg by mouth at bedtime.         . phenytoin (DILANTIN) 100 MG ER capsule   Oral  Take 100 mg by mouth daily.         . polyethylene glycol (MIRALAX / GLYCOLAX) packet   Oral   Take 17 g by mouth daily.         . traZODone (DESYREL) 100 MG tablet   Oral   Take 100 mg by mouth at bedtime.         Marland Kitchen levofloxacin (LEVAQUIN) 750 MG tablet   Oral   Take 1 tablet (750 mg total) by mouth daily.   5 tablet   0     Stop date 01/23/15     Allergies Review of patient's allergies indicates no known allergies.  Family History  Problem Relation Age of Onset  . Family history unknown: Yes    Social History Social History  Substance Use Topics  . Smoking status: Former Games developer  . Smokeless tobacco: None  . Alcohol Use: No    Review of Systems  Unable to assess due to patient dementia ____________________________________________   PHYSICAL EXAM:  VITAL SIGNS: ED Triage Vitals  Enc Vitals Group     BP 06/10/15 0021 148/98 mmHg     Pulse Rate 06/10/15 0021 88     Resp 06/10/15 0021 18     Temp 06/10/15 0021 97.5 F (36.4 C)     Temp Source 06/10/15 0021 Oral     SpO2 06/10/15 0021 95 %     Weight --      Height --      Head Cir --      Peak Flow --      Pain Score --      Pain Loc --      Pain Edu? --      Excl. in GC? --      Constitutional: Alert and disoriented. Well appearing and in no acute distress. Eyes: Conjunctivae are normal. PERRL. EOMI. Head: small hematoma to left forehead Nose: No congestion/rhinnorhea. Mouth/Throat: Mucous membranes are moist.  Oropharynx non-erythematous. Neck:No cervical spine tenderness to palpation. Cardiovascular: Normal rate, regular rhythm. Grossly normal heart sounds.  Good peripheral circulation. Respiratory: Normal respiratory effort.  No retractions. Lungs CTAB. Gastrointestinal: Soft and nontender. No distention. Positive bowel sounds Musculoskeletal: No lower extremity tenderness nor edema.  . Neurologic:  Normal speech and language. Patient sitting up in the bed rubbing her hands together. Skin:  Skin is warm, dry and intact.  Psychiatric: Mood and affect are normal.   ____________________________________________   LABS (all labs ordered are listed, but only abnormal results are displayed)  Labs Reviewed - No data to display ____________________________________________  EKG  None ____________________________________________  RADIOLOGY  CT head and cervical spine: No evidence of traumatic intracranial injury or fracture. No evidence of acute fracture or subluxation along the cervical spine, moderate cortical volume loss and scattered small vessel ischemic microangiopathy. Marked degenerative change noted along the cervical spine. Degenerative change at the TMJ joints bilaterally. ____________________________________________   PROCEDURES  Procedure(s) performed: None  Critical Care performed: No  ____________________________________________   INITIAL IMPRESSION / ASSESSMENT AND PLAN / ED COURSE  Pertinent labs & imaging results that were available during my care of the patient were reviewed by me and considered in my medical decision making (see chart for details).  This is a 79 year old female who comes into the hospital today with a fall and  a bruise noted to her head. The patient does have a small hematoma to her left forehead. I will do a CT scan of the head and  neck and reassess the patient.  ----------------------------------------- 2:00 AM on 06/10/2015 -----------------------------------------  The patient is sleeping in the emergency department in no acute distress. The patient's CT is unremarkable. I will send the patient back to her nursing home. ____________________________________________   FINAL CLINICAL IMPRESSION(S) / ED DIAGNOSES  Final diagnoses:  Fall, initial encounter  Head injury, initial encounter      Rebecka Apley, MD 06/10/15 0200

## 2015-11-06 IMAGING — CT CT HEAD WITHOUT CONTRAST
2 series · 15 of 30 positions shown, 19 images · non-contrast
Comparison: CT head 01/29/2012.

CLINICAL DATA: Altered mental status. Mental retardation.
Confusion.

EXAM:
CT HEAD WITHOUT CONTRAST
TECHNIQUE: Contiguous axial images were obtained from the base of the skull
through the vertex without contrast.

[Series 2: head wo · axial · 0.50mm/px · z∈[-257,-132]mm · 13 of 31 slices shown, 17 images]
[im 3/31  brain]
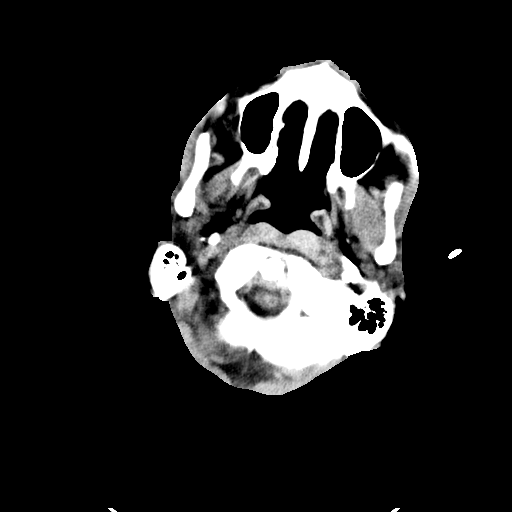
[im 3/31  bone]
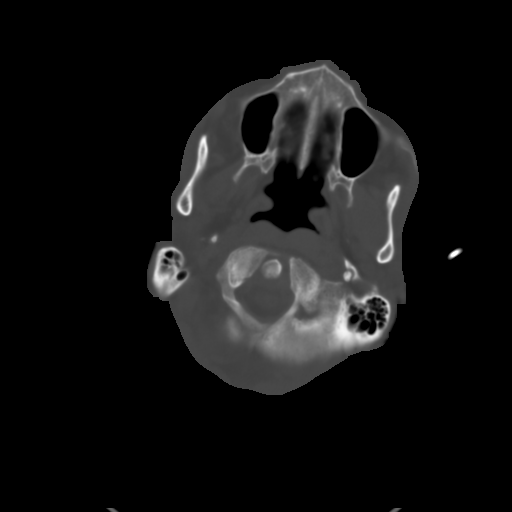
[im 5/31  brain]
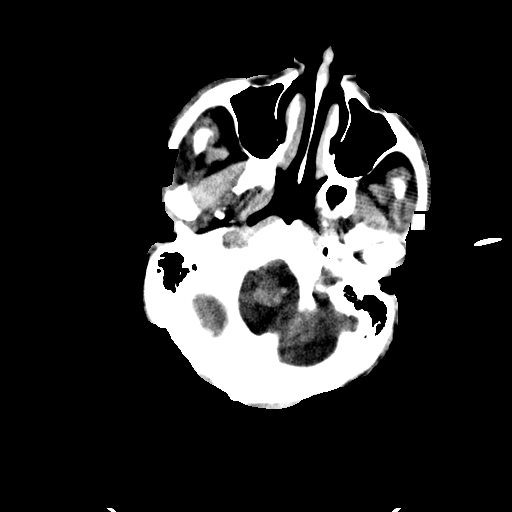
[im 7/31  brain]
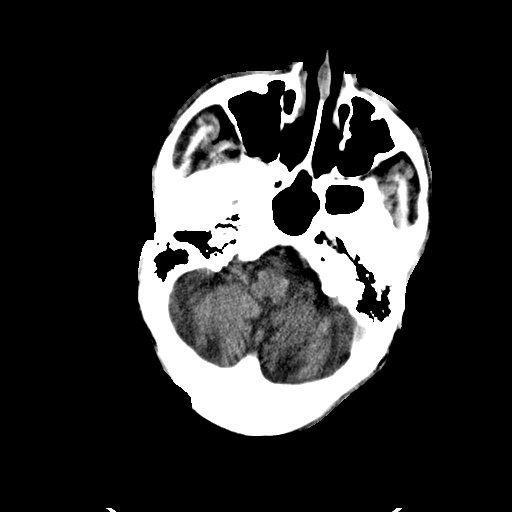
[im 9/31  brain]
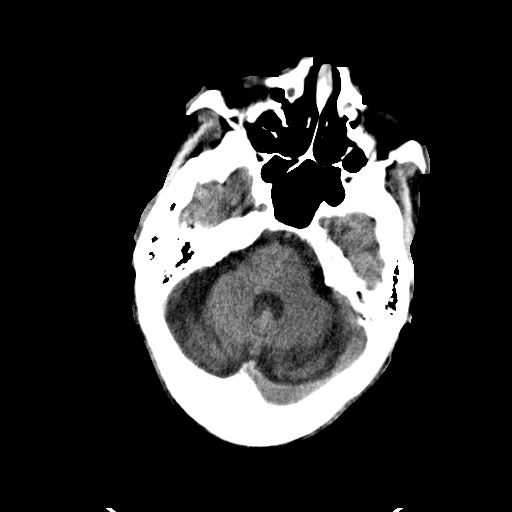
[im 11/31  brain]
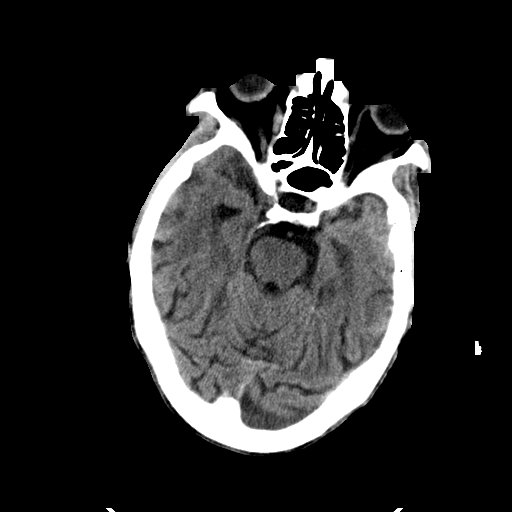
[im 11/31  bone]
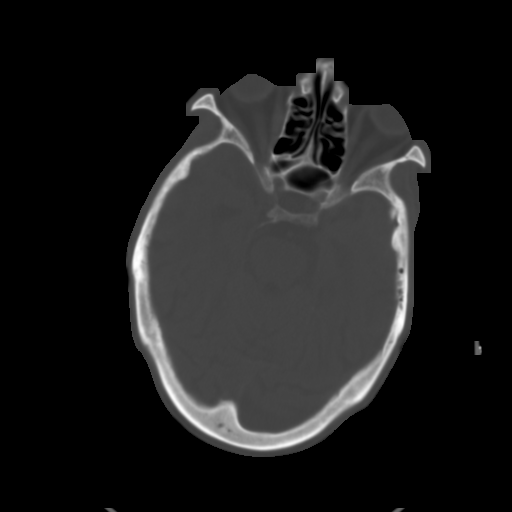
[im 13/31  brain]
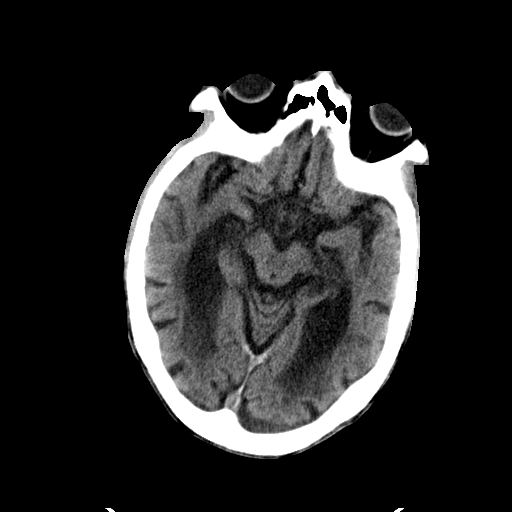
[im 16/31  brain]
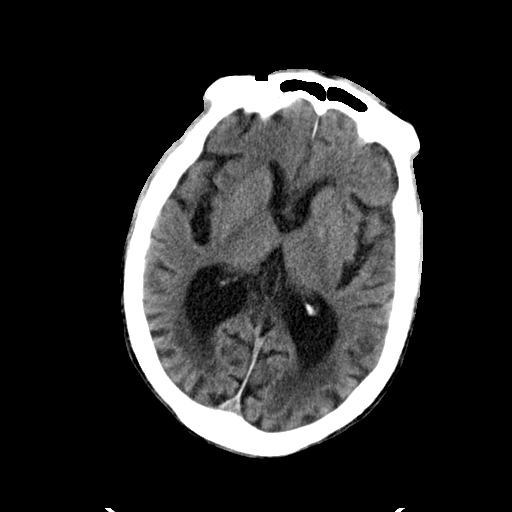
[im 18/31  brain]
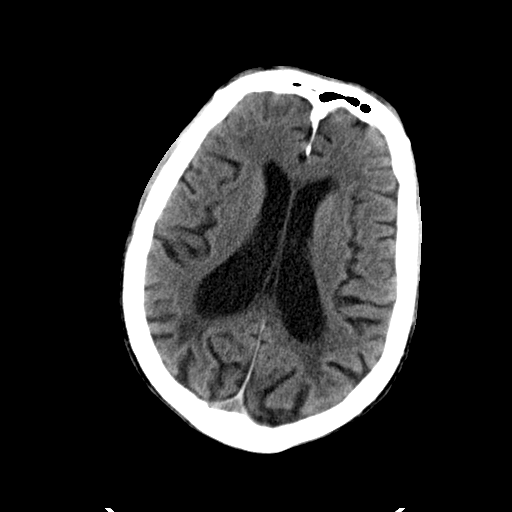
[im 20/31  brain]
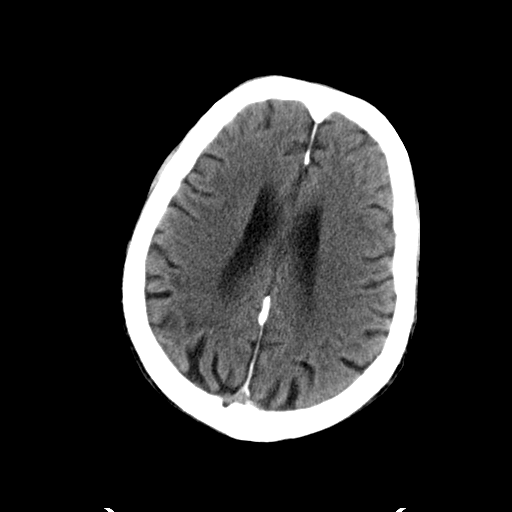
[im 20/31  bone]
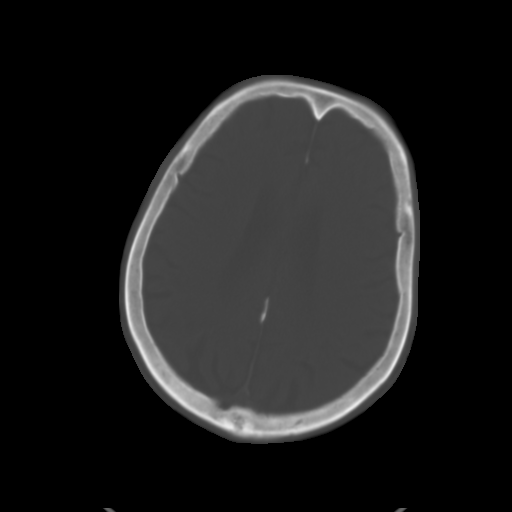
[im 22/31  brain]
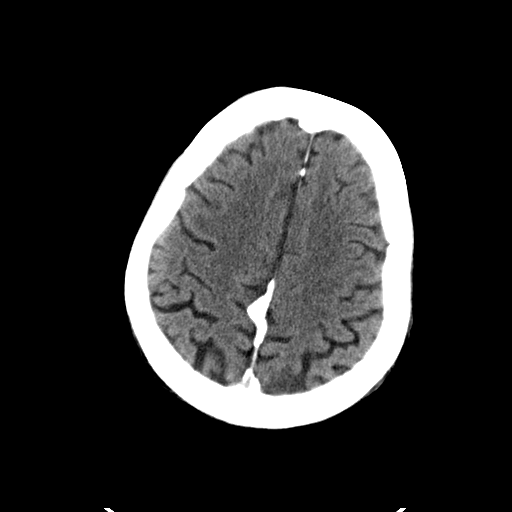
[im 24/31  brain]
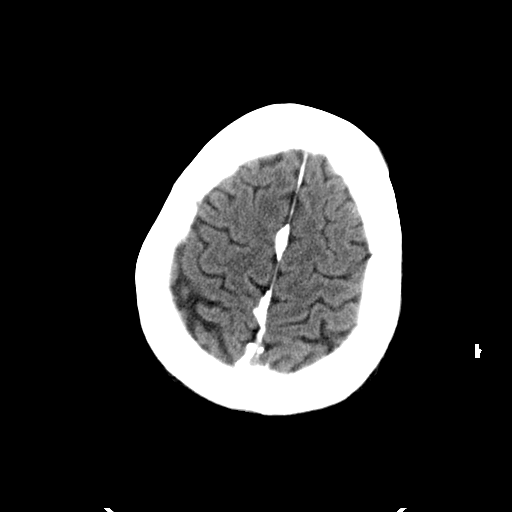
[im 26/31  brain]
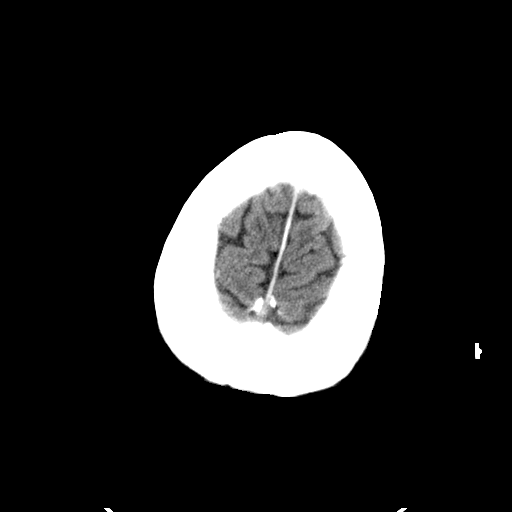
[im 28/31  brain]
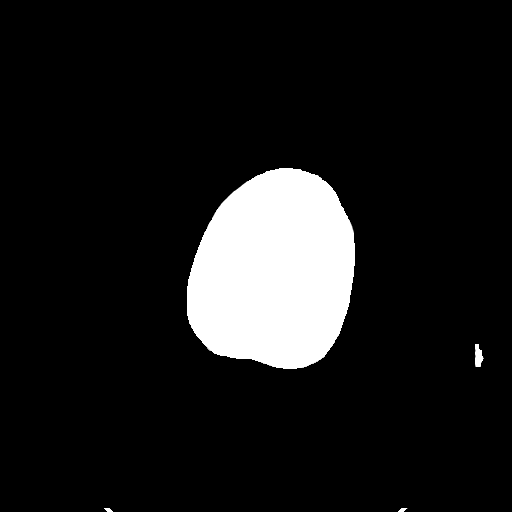
[im 28/31  bone]
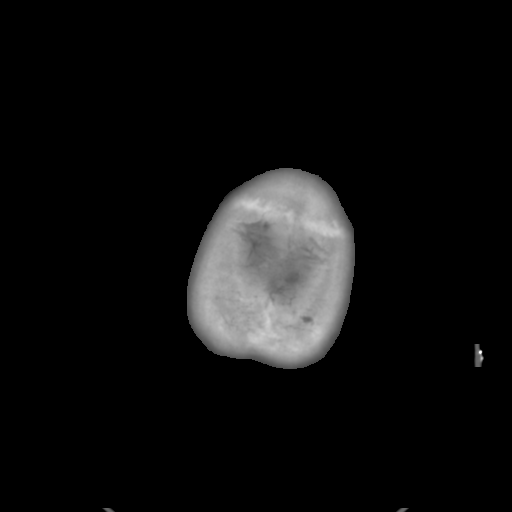

[Series 4: head wo2 · axial · 0.50mm/px · z∈[-209,-190]mm · 2 of 31 slices shown]
[im 3/31  brain]
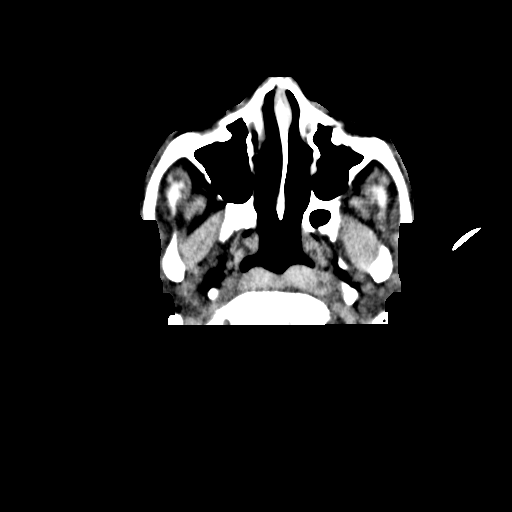
[im 7/31  brain]
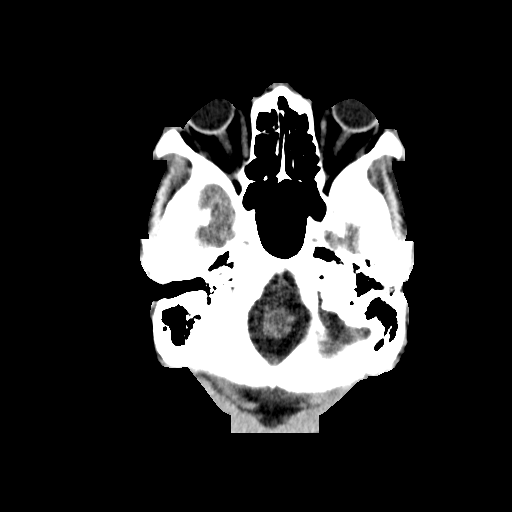

[15 of 30 positions shown; findings below may reference images not displayed]

FINDINGS: There is progression of bilateral lateral ventricular enlargement in
comparison with previous exam. The occipital horns are particularly
dilated. This occipital horn enlargement is accompanied by
periventricular hypoattenuation, which could represent
transependymal absorption, versus progressive small vessel disease.
The third and fourth ventricles are also mildly enlarged compared
with priors. There is a paucity of cortical atrophy. There is no
visible acute stroke or hemorrhage and no extra-axial fluid.
Calvarium is intact. No acute sinus or mastoid disease.
IMPRESSION: Ventricular enlargement in comparison with 01/29/2012. Communicating
or normal pressure hydrocephalus is not excluded.

Central atrophy with hypoattenuation of the white matter from
chronic microvascular ischemic change is another consideration.

There is no visible acute intracranial abnormality.

## 2018-07-16 ENCOUNTER — Encounter: Payer: Self-pay | Admitting: Nurse Practitioner

## 2018-07-16 ENCOUNTER — Non-Acute Institutional Stay: Payer: Commercial Managed Care - HMO | Admitting: Nurse Practitioner

## 2018-07-16 VITALS — BP 130/73 | HR 84 | Temp 98.8°F | Resp 18 | Wt 143.9 lb

## 2018-07-16 DIAGNOSIS — R413 Other amnesia: Secondary | ICD-10-CM | POA: Insufficient documentation

## 2018-07-16 DIAGNOSIS — Z515 Encounter for palliative care: Secondary | ICD-10-CM | POA: Insufficient documentation

## 2018-07-16 DIAGNOSIS — R5381 Other malaise: Secondary | ICD-10-CM | POA: Insufficient documentation

## 2018-07-16 NOTE — Progress Notes (Signed)
[facility] Telephone: [phone] Fax: [phone]  PATIENT NAME: Julia Coffey DOB: 03-Jun-1936 MRN: [id number]  PRIMARY CARE PROVIDER:   Dr Jamas Lav PROVIDER:  Dr [facility] RESPONSIBLE PARTY:   Caleen [place] DSS Guardian [phone]  RECOMMENDATIONS and PLAN:   1. Palliative care encounter Z51.5; Palliative medicine team will continue to support patient, patient's family, and medical team. Visit consisted of counseling and education dealing with the complex and emotionally intense issues of symptom management and palliative care in the setting of serious and potentially life-threatening illnes  Medical goals DNR / do not intubate / do not hospitalize no blood transfusions, no diagnostic testing, no feeding tube, no surgical intervention but yes to lab test, yes to IV fluids, yes to antibiotic therapy  2. Debility R53.81 secondary to Dementia progressive, encourage passive rom; encourage to transfer to geri-chair.   3. Memory loss R41.3 secondary to Dementia. Continue with supportive measures as chronic disease remains Progressive.  ASSESSMENT:     I visited and observed Julia Coffey. She was cooperative with assessment. She did not yell during palliative care visit. She did not open her eyes nor make eye contact or any verbal expression. No meaningful discussion due to severe cognitive impairment. At present time Julia Coffey does continue to remain stable in the study of chronic disease. Behaviors are being managed by psychiatry. Will continue to follow a monitor weights, for decline with palliative care. No new changes to current goals or plan of care. I have updated nursing staff. Will fax notes to DSS Guardian  BMI 26 .3 10 / 7 / 2019 weight 146 lbs 12 / 3 / 2018 weight 141.6 lbs 1 / 2 / 2020 weight 143.9 lbs  I spent 45 minutes providing this consultation,  from 1:00pm to 1:45pm More than 50% of the time in this consultation was spent  coordinating communication.   HISTORY OF PRESENT ILLNESS:  Julia Coffey is a 83 y.o. year old femaleemale with multiple medical problems including Alzheimer's dementia with psychosis, conversion disorder, seizure disorder, peripheral vascular disease, anemia, dysphasia, osteoarthritis, hypothyroidism, depression, Pseudobulbar Affect, protein calorie malnutrition, insomnia. She continues to reside at skilled [facility] at [facility]. She is bed bound is a lift to the recliner. She is functioning quadriplegic, totally bed down, ADL dependent with incontinence of bowel and bladder. She is severely cognitively impaired. No meaningful discussion though she will scream intermittently throughout the day. She is followed by Psychiatry at the facility with last date of service 1/2 / 2020 for initial assessment for Alzheimer's disease, mood disorder, anxiety, major NCD,  Pseudobulbar , insomnia. She's currently being prescribed Zyprexa, Depakote for behaviors secondary to dementia. Dilantin for seizure disorder.  No  changes to medications at this visit. Last seen by primary provider 1 / 17 / 2024 chronic disease. Palliative Care was asked to help address goals of care.   CODE STATUS: DNR  PPS: 30% HOSPICE ELIGIBILITY/DIAGNOSIS: TBD  PAST MEDICAL HISTORY:  Past Medical History:  Diagnosis Date  . Dementia (HCC)   . Depression   . Hypothyroidism   . Seizures (HCC)     SOCIAL HX:  Social History   Tobacco Use  . Smoking status: Former Smoker  Substance Use Topics  . Alcohol use: No    ALLERGIES: No Known Allergies   PERTINENT MEDICATIONS:  Outpatient Encounter Medications as of 07/16/2018  Medication Sig  . acetaminophen (TYLENOL) 650 MG CR tablet Take 650 mg by  mouth every 12 (twelve) hours.  . calcium-vitamin D (OSCAL WITH D) 500-200 MG-UNIT per tablet Take 1 tablet by mouth every 12 (twelve) hours.  . Cholecalciferol (VITAMIN D3) 5000 units TABS Take 1 tablet  by mouth every 30 (thirty) days. Last dose was given 05/31/15  . divalproex (DEPAKOTE SPRINKLES) 125 MG capsule Take 250 mg by mouth 3 (three) times daily.   Marland Kitchen. donepezil (ARICEPT) 10 MG tablet Take 10 mg by mouth daily.  . furosemide (LASIX) 20 MG tablet Take 20 mg by mouth daily as needed for edema.  Marland Kitchen. levofloxacin (LEVAQUIN) 750 MG tablet Take 1 tablet (750 mg total) by mouth daily.  Marland Kitchen. levothyroxine (SYNTHROID, LEVOTHROID) 25 MCG tablet Take 25 mcg by mouth daily before breakfast.  . loratadine (CLARITIN) 10 MG tablet Take 10 mg by mouth daily.  Marland Kitchen. LORazepam (ATIVAN) 0.5 MG tablet Take 0.5 mg by mouth 2 (two) times daily.  . mirtazapine (REMERON) 15 MG tablet Take 15 mg by mouth at bedtime.  Marland Kitchen. OLANZapine (ZYPREXA) 5 MG tablet Take 10 mg by mouth 2 (two) times daily.   Marland Kitchen. PHENobarbital (LUMINAL) 64.8 MG tablet Take 64.8 mg by mouth at bedtime.  . phenytoin (DILANTIN) 100 MG ER capsule Take 100 mg by mouth daily.  . polyethylene glycol (MIRALAX / GLYCOLAX) packet Take 17 g by mouth daily.  . traZODone (DESYREL) 100 MG tablet Take 100 mg by mouth at bedtime.   No facility-administered encounter medications on file as of 07/16/2018.     PHYSICAL EXAM:   General: NAD, severely debilitated, chronically ill, confused female Cardiovascular: regular rate and rhythm Pulmonary: clear ant fields Abdomen: soft, nontender, + bowel sounds GU: no suprapubic tenderness Extremities: no edema, no joint deformities Skin: no rashes Neurological: Weakness but otherwise nonfocal/functional quadriplegic  Julia Coffey Julia RomeZ Noelly Lasseigne, NP

## 2019-08-12 DEATH — deceased
